# Patient Record
Sex: Female | Born: 1970 | Race: White | Hispanic: No | Marital: Married
Health system: Southern US, Community
[De-identification: ages and names within clinical notes are randomized; demographics above are authoritative.]

---

## 2020-03-25 ENCOUNTER — Other Ambulatory Visit: Payer: Self-pay | Admitting: Internal Medicine

## 2020-03-25 DIAGNOSIS — M7122 Synovial cyst of popliteal space [Baker], left knee: Secondary | ICD-10-CM

## 2020-03-25 DIAGNOSIS — M79609 Pain in unspecified limb: Secondary | ICD-10-CM

## 2020-03-31 ENCOUNTER — Other Ambulatory Visit: Payer: Self-pay

## 2020-03-31 ENCOUNTER — Ambulatory Visit
Admission: RE | Admit: 2020-03-31 | Discharge: 2020-03-31 | Disposition: A | Discharge status: Home / Self Care | Payer: Managed Care, Other (non HMO) | Source: Ambulatory Visit | Attending: Internal Medicine | Admitting: Internal Medicine

## 2020-03-31 ENCOUNTER — Other Ambulatory Visit: Payer: Self-pay | Admitting: Internal Medicine

## 2020-03-31 DIAGNOSIS — M79609 Pain in unspecified limb: Secondary | ICD-10-CM | POA: Insufficient documentation

## 2020-03-31 DIAGNOSIS — M7122 Synovial cyst of popliteal space [Baker], left knee: Secondary | ICD-10-CM | POA: Diagnosis present

## 2020-04-14 ENCOUNTER — Other Ambulatory Visit: Payer: Self-pay | Admitting: Sports Medicine

## 2020-04-14 DIAGNOSIS — M7122 Synovial cyst of popliteal space [Baker], left knee: Secondary | ICD-10-CM

## 2020-04-14 DIAGNOSIS — G8929 Other chronic pain: Secondary | ICD-10-CM

## 2020-04-14 DIAGNOSIS — M25562 Pain in left knee: Secondary | ICD-10-CM

## 2020-04-30 ENCOUNTER — Ambulatory Visit (INDEPENDENT_AMBULATORY_CARE_PROVIDER_SITE_OTHER): Payer: Managed Care, Other (non HMO) | Admitting: Dermatology

## 2020-04-30 ENCOUNTER — Other Ambulatory Visit: Payer: Self-pay

## 2020-04-30 DIAGNOSIS — Z1283 Encounter for screening for malignant neoplasm of skin: Secondary | ICD-10-CM

## 2020-04-30 DIAGNOSIS — D1800 Hemangioma unspecified site: Secondary | ICD-10-CM | POA: Diagnosis not present

## 2020-04-30 DIAGNOSIS — L409 Psoriasis, unspecified: Secondary | ICD-10-CM

## 2020-04-30 DIAGNOSIS — I781 Nevus, non-neoplastic: Secondary | ICD-10-CM

## 2020-04-30 DIAGNOSIS — D229 Melanocytic nevi, unspecified: Secondary | ICD-10-CM

## 2020-04-30 DIAGNOSIS — L578 Other skin changes due to chronic exposure to nonionizing radiation: Secondary | ICD-10-CM

## 2020-04-30 MED ORDER — CLOBETASOL PROPIONATE 0.05 % EX SHAM: 1.0000 "application " | MEDICATED_SHAMPOO | CUTANEOUS | 3 refills | Status: AC

## 2020-04-30 NOTE — Progress Notes (Signed)
   New Patient Visit  Subjective  Nancy Savage is a 49 y.o. female who presents for the following: Psoriasis (scalp >10 yrs, [facility] cr in past, scalpicin, clobetasol shampoo, hx of PA  rheumatology cleared her, she has orthopedic appt to check recent flare with knee) and mole check (total body skin exam).   The following portions of the chart were reviewed this encounter and updated as appropriate:  Allergies  Meds  Problems  Med Hx  Surg Hx  Fam Hx      Review of Systems:  No other skin or systemic complaints except as noted in HPI or Assessment and Plan.  Objective  Well appearing patient in no apparent distress; mood and affect are within normal limits.  A full examination was performed including scalp, head, eyes, ears, nose, lips, neck, chest, axillae, abdomen, back, buttocks, bilateral upper extremities, bilateral lower extremities, hands, feet, fingers, toes, fingernails, and toenails. All findings within normal limits unless otherwise noted below.  Objective  Scalp: Excoriations neck   Assessment & Plan    Melanocytic Nevi - Tan-brown and/or pink-flesh-colored symmetric macules and papules - Benign appearing on exam today - Observation - Call clinic for new or changing moles - Recommend daily use of broad spectrum spf 30+ sunscreen to sun-exposed areas.   Telangiectasia - Legs - Dilated blood vessel - Benign appearing on exam - Call for changes  Actinic Damage - diffuse scaly erythematous macules with underlying dyspigmentation - Recommend daily broad spectrum sunscreen SPF 30+ to sun-exposed areas, reapply every 2 hours as needed.  - Call for new or changing lesions.  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 Meda Coffee.  She referred pt for Ultrasound and to Orthopedics for MRI of L  knee.  Pt will f/u with Dr. Meda Coffee after MRI and we will f/u and txt according to Dr. Carlyn Reichert findings.  Discussed txt options, topical txt, [facility] Nail, biologics (Humira, Cosentyx, Taltz)  Start Clobetasol shampoo qd x 2wks, then qd up to 5d/wk prn flares, let sit on scalp for 15 minutes and rinse out   Clobetasol Propionate 0.05 % shampoo - Scalp  Return in about 2 months (around 06/30/2020) for Psoriasis.  I, Othelia Pulling, RMA, am acting as scribe for Sarina Ser, MD .  Documentation: I have reviewed the above documentation for accuracy and completeness, and I agree with the above.  Sarina Ser, MD

## 2020-05-05 ENCOUNTER — Encounter: Payer: Self-pay | Admitting: Dermatology

## 2020-05-19 IMAGING — US US EXTREM LOW*L* LIMITED
1 series · 14 of 22 positions shown · non-contrast
Comparison: None.

CLINICAL DATA: Left popliteal pain.

EXAM:
ULTRASOUND left LOWER EXTREMITY LIMITED
TECHNIQUE: Ultrasound examination of the lower extremity soft tissues was
performed in the area of clinical concern.

[Series 1: us extrem low*left* limited · 0.10mm/px · 22 acquisitions, 14 frames shown]
[im 1/22]
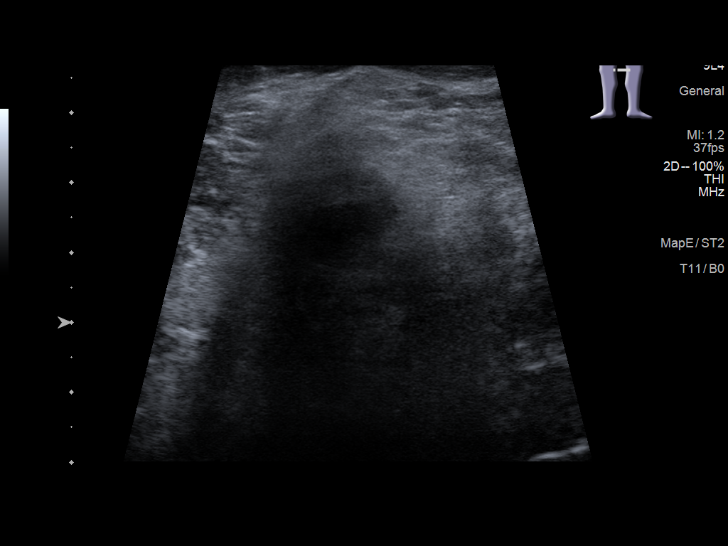
[im 3/22]
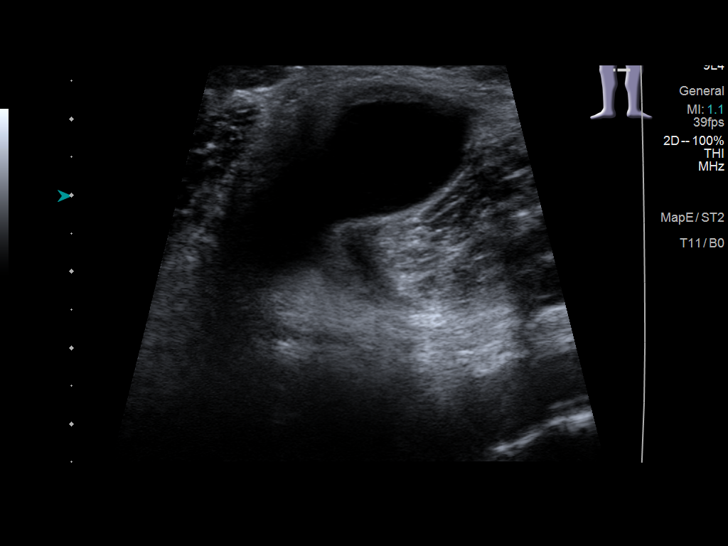
[im 4/22]
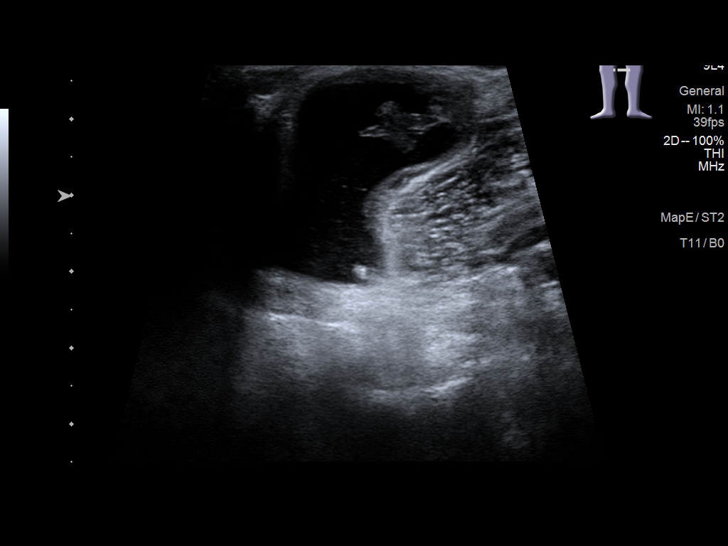
[im 6/22]
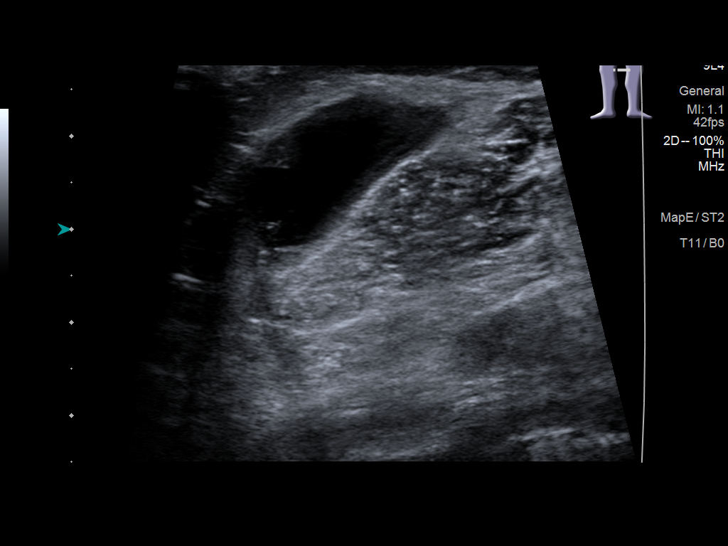
[im 8/22]
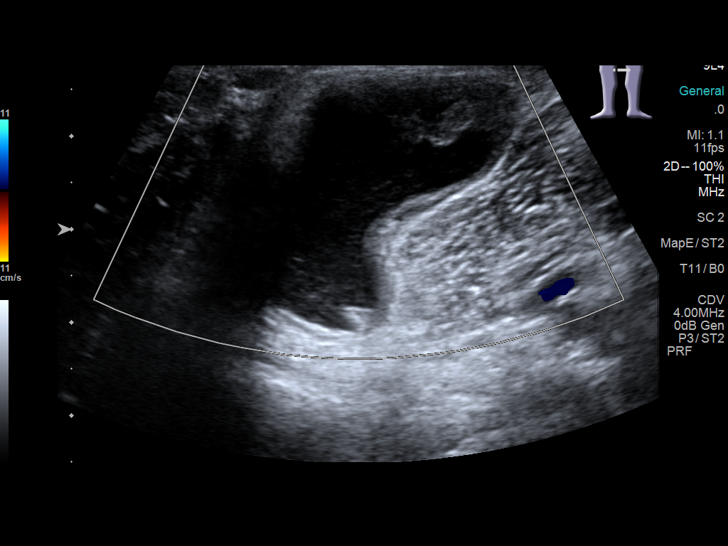
[im 9/22]
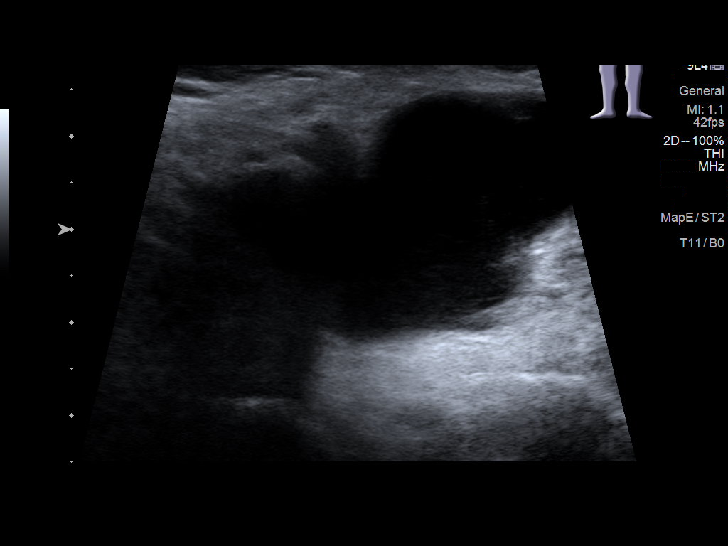
[im 11/22]
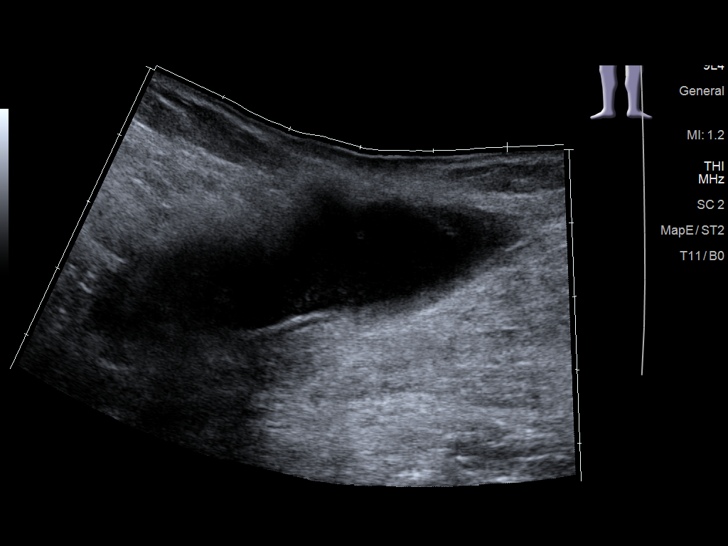
[im 12/22]
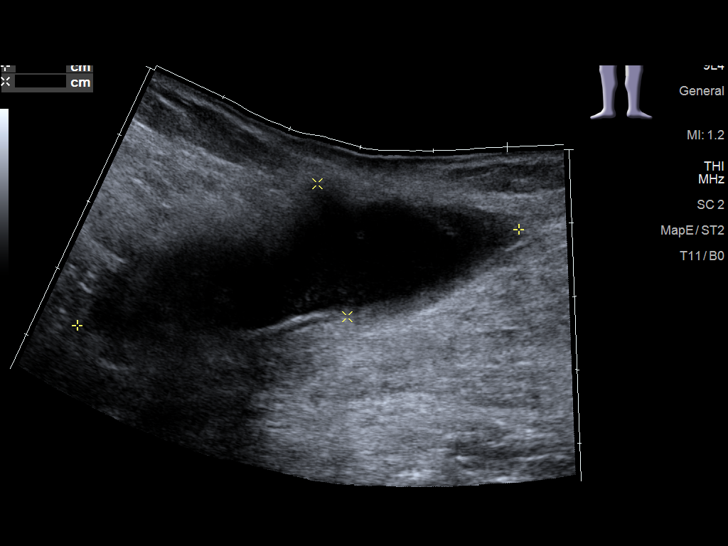
[im 14/22]
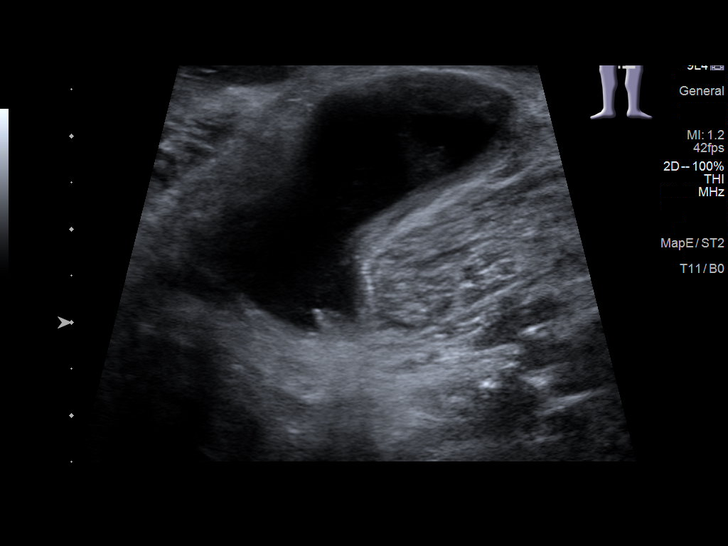
[im 15/22]
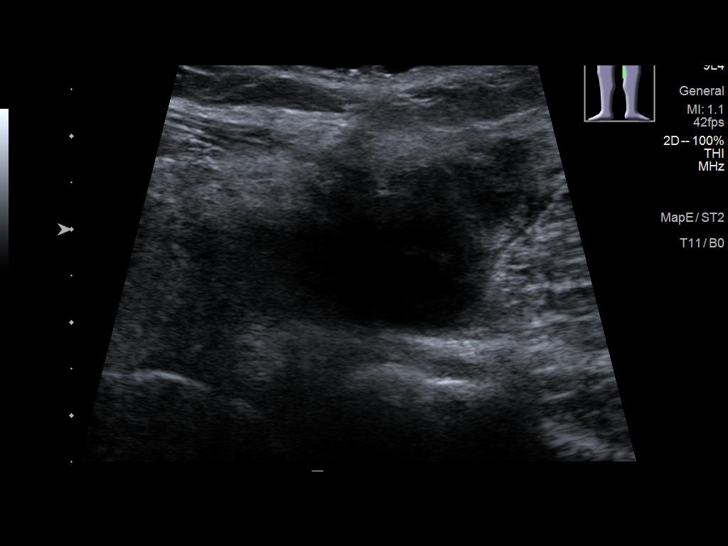
[im 17/22]
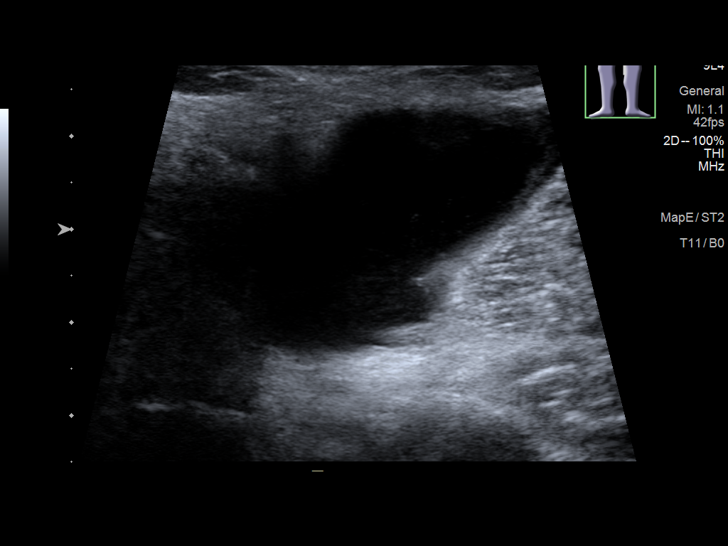
[im 19/22]
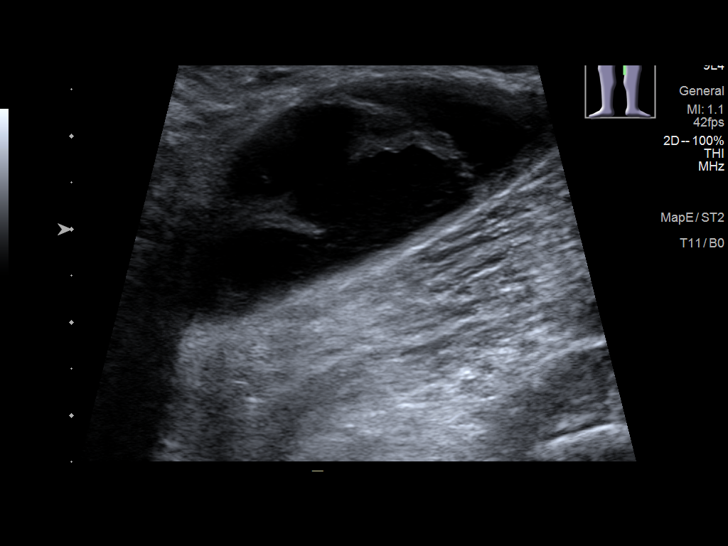
[im 20/22]
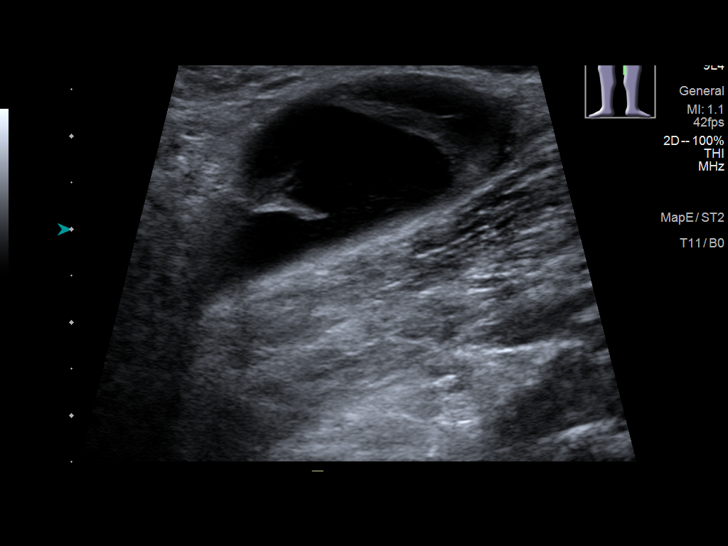
[im 22/22]
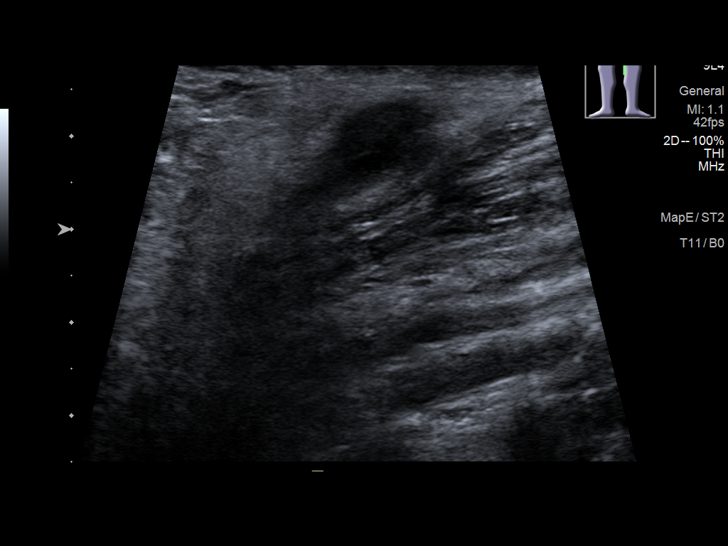

[14 of 22 positions shown; findings below may reference images not displayed]

FINDINGS: Prominent cystic structure is identified in the left popliteal
fossa. This corresponds to the area of pain and tightness. The
lesion measures 6.1 x 1.9 x 4.2 cm. Some echogenic debris is present
within the collection. No increased flow is evident.
IMPRESSION: 1. 6.1 cm well-defined cystic lesion within the popliteal fossa
compatible with a Baker's cyst.

## 2020-05-26 ENCOUNTER — Other Ambulatory Visit: Payer: Self-pay

## 2020-05-26 ENCOUNTER — Ambulatory Visit
Admission: RE | Admit: 2020-05-26 | Discharge: 2020-05-26 | Disposition: A | Discharge status: Home / Self Care | Payer: Managed Care, Other (non HMO) | Source: Ambulatory Visit | Attending: Sports Medicine | Admitting: Sports Medicine

## 2020-05-26 DIAGNOSIS — G8929 Other chronic pain: Secondary | ICD-10-CM

## 2020-05-26 DIAGNOSIS — M25562 Pain in left knee: Secondary | ICD-10-CM

## 2020-05-26 DIAGNOSIS — M7122 Synovial cyst of popliteal space [Baker], left knee: Secondary | ICD-10-CM

## 2022-11-09 ENCOUNTER — Other Ambulatory Visit
Admission: RE | Admit: 2022-11-09 | Discharge: 2022-11-09 | Disposition: A | Discharge status: Home / Self Care | Payer: Managed Care, Other (non HMO) | Source: Ambulatory Visit | Attending: Sports Medicine | Admitting: Sports Medicine

## 2022-11-09 DIAGNOSIS — M25461 Effusion, right knee: Secondary | ICD-10-CM | POA: Insufficient documentation

## 2022-11-09 DIAGNOSIS — M1711 Unilateral primary osteoarthritis, right knee: Secondary | ICD-10-CM | POA: Insufficient documentation

## 2022-11-09 DIAGNOSIS — M25462 Effusion, left knee: Secondary | ICD-10-CM | POA: Diagnosis present

## 2022-11-09 DIAGNOSIS — L409 Psoriasis, unspecified: Secondary | ICD-10-CM | POA: Diagnosis present

## 2022-11-09 DIAGNOSIS — M7122 Synovial cyst of popliteal space [Baker], left knee: Secondary | ICD-10-CM | POA: Insufficient documentation

## 2022-11-09 DIAGNOSIS — M7121 Synovial cyst of popliteal space [Baker], right knee: Secondary | ICD-10-CM | POA: Diagnosis present

## 2022-11-09 DIAGNOSIS — M25561 Pain in right knee: Secondary | ICD-10-CM | POA: Insufficient documentation

## 2022-11-09 DIAGNOSIS — G8929 Other chronic pain: Secondary | ICD-10-CM | POA: Diagnosis present

## 2022-11-09 DIAGNOSIS — M25562 Pain in left knee: Secondary | ICD-10-CM | POA: Diagnosis present

## 2022-11-09 LAB — SYNOVIAL CELL COUNT + DIFF, W/ CRYSTALS
Crystals, Fluid: NONE SEEN
Eosinophils-Synovial: 0 %
Lymphocytes-Synovial Fld: 20 %
Monocyte-Macrophage-Synovial Fluid: 13 %
Neutrophil, Synovial: 67 %
WBC, Synovial: 6533 /mm3 — ABNORMAL HIGH (ref 0–200)

## 2022-11-10 ENCOUNTER — Other Ambulatory Visit: Payer: Self-pay | Admitting: Sports Medicine

## 2022-11-10 DIAGNOSIS — M7122 Synovial cyst of popliteal space [Baker], left knee: Secondary | ICD-10-CM

## 2022-11-10 DIAGNOSIS — G8929 Other chronic pain: Secondary | ICD-10-CM

## 2022-11-10 DIAGNOSIS — M25462 Effusion, left knee: Secondary | ICD-10-CM
